# Patient Record
Sex: Male | Born: 1955 | Race: Black or African American | Hispanic: No | Smoking: Never smoker
Health system: Southern US, Community
[De-identification: ages and names within clinical notes are randomized; demographics above are authoritative.]

## PROBLEM LIST (undated history)

## (undated) DIAGNOSIS — J45909 Unspecified asthma, uncomplicated: Secondary | ICD-10-CM

## (undated) HISTORY — DX: Unspecified asthma, uncomplicated: J45.909

---

## 1977-06-29 HISTORY — PX: FRACTURE SURGERY: SHX138

## 2013-01-11 ENCOUNTER — Ambulatory Visit: Payer: Self-pay

## 2013-01-11 ENCOUNTER — Telehealth: Payer: Self-pay | Admitting: Radiology

## 2013-01-11 ENCOUNTER — Ambulatory Visit: Payer: Self-pay | Admitting: Family Medicine

## 2013-01-11 VITALS — BP 132/72 | HR 91 | Temp 98.2°F | Resp 20 | Ht 73.0 in | Wt 263.0 lb

## 2013-01-11 DIAGNOSIS — M25522 Pain in left elbow: Secondary | ICD-10-CM

## 2013-01-11 DIAGNOSIS — M25552 Pain in left hip: Secondary | ICD-10-CM

## 2013-01-11 DIAGNOSIS — M25529 Pain in unspecified elbow: Secondary | ICD-10-CM

## 2013-01-11 DIAGNOSIS — M25559 Pain in unspecified hip: Secondary | ICD-10-CM

## 2013-01-11 DIAGNOSIS — R0902 Hypoxemia: Secondary | ICD-10-CM

## 2013-01-11 DIAGNOSIS — J45909 Unspecified asthma, uncomplicated: Secondary | ICD-10-CM

## 2013-01-11 MED ORDER — DOXYCYCLINE HYCLATE 100 MG PO TABS: 100.0000 mg | ORAL_TABLET | Freq: Two times a day (BID) | ORAL | Status: DC

## 2013-01-11 MED ORDER — ALBUTEROL SULFATE HFA 108 (90 BASE) MCG/ACT IN AERS: 2.0000 | INHALATION_SPRAY | Freq: Four times a day (QID) | RESPIRATORY_TRACT | Status: AC | PRN

## 2013-01-11 NOTE — Patient Instructions (Addendum)
I will be in touch with you with your D dimer result later today. This will determine if we need to do a CT scan to rule- out a pulmonary embolus.    I am also going to put you on a course of doxycycline antibiotic because the radiologist feels you may have mild bronchitis.  I refilled your albuterol inhaler.  Also, there is a 3mm nodule in your right lung which is probably just a scar.  You might want to have a recheck x-ray in 3- 6 months.  We have given you a CD of your films, and a copy of your x-ray reports.    Let me know if your hip and/ or elbow are not feeling better.  Wear the splint on your left elbow for the next few days.  If your elbow feels much better in 5 day or so it is unlikely to be fractured.  However, if it is still tender you may have a fracture.  In this case please come in for a recheck

## 2013-01-11 NOTE — Progress Notes (Signed)
[facility] [address], [place] [address] [phone]  Date:  01/11/2013   Name:  Ronnie Phillips   DOB:  1955/11/11   MRN:  [id number]  PCP:  No primary provider on file.    Chief Complaint: Fall, Sciatica and Asthma   History of Present Illness:  Ronnie Phillips is a 57 y.o. very pleasant male patient who presents with the following:  He has a history of asthma. His sx have been worse for the last week due to weather changes.  He noted that his sx became worse when he arrived in [place] about 4 days ago He fell last night at his hotel; he caught his cane in the revolving door.  He fell onto his left side,  hurt his left elbow, and left hip. He applied ice, but is now worried about the elbow.    He uses singulair, and albuterol.  He uses symbicort.  He has sciatica in his left leg and uses a cane sometimes at baseline- he is uisiong this now.  He has a histoyr of pulmonary HTN and uses ?O2 at night at baseline- this may actually be a CPAP machine.    He is here visiting from out of town for a convention- he lives in [place] but travels quite a bit.    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   Non- smoker Never had a DVT/ PE but reports he has been tested for this in the past- he thinks twice- with CT angiogram.    There are no active problems to display for this patient.   Past Medical History  Diagnosis Date  . Asthma     Past Surgical History  Procedure Laterality Date  . Fracture surgery Left 1979    ELBOW    History  Substance Use Topics  . Smoking status: Never Smoker   . Smokeless tobacco: Not on file  . Alcohol Use: No    History reviewed. No pertinent family history.  Allergies no known allergies  Medication list has been reviewed and updated.  No current outpatient prescriptions on file prior to visit.   No current facility-administered medications  on file prior to visit.    Review of Systems:  As per HPI- otherwise negative. He has seen doctors around [place] in the past, mostly in the [place] area.  He has never been seen at [facility] in the past.    Physical Examination: Filed Vitals:   01/11/13 1152  BP: 132/72  Pulse: 91  Temp:   Resp:    Filed Vitals:   01/11/13 0945  Height: 6\' 1"  (1.854 m)  Weight: 263 lb (119.296 kg)   Body mass index is 34.71 kg/(m^2). Ideal Body Weight: Weight in (lb) to have BMI = 25: 189.1  GEN: WDWN, NAD, Non-toxic, A & O x 3, obese HEENT: Atraumatic, Normocephalic. Neck supple. No masses, No LAD.  Bilateral TM wnl, oropharynx normal.  PEERL,EOMI.   Ears and Nose: No external deformity. CV: RRR, No M/G/R. No JVD. No thrill. No extra heart sounds. PULM: CTA B, no wheezes, crackles, rhonchi. No retractions. No resp. distress. No accessory muscle use. ABD: S, NT, ND. No rebound. No HSM. No particular pain over the sternum.   EXTR: No c/c/e NEURO slow gait, cane right hand.  PSYCH: Normally interactive. Conversant. Not depressed or anxious appearing.  Calm demeanor.  Left elbow: surgical scars.  Not able to fully extend but this is baseline per pt.  He has normal flexion and supination/ pronation.  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   No saddle anesthesia.  However, there is no severe pain to indicate a fracture in the hip or femur.   [facility] reading (PRIMARY) by  Dr. Patsy Lager. CXR:  chonic appearing scarring, no infiltrate or effusion noted  Left Eelbow:  Past history of major elbow surgery.  No acute fracture noted Left hip: no definite fracture, severe degenerative change  Left femur: negative for fracture  LEFT ELBOW - COMPLETE 3+ VIEW  Comparison: None.  Findings: Prominent degenerative changes limit evaluation for detection of subtle  injury. There is minimal bony overgrowth of the radial head which appears remote without definitive fracture noted.  Mild soft tissue prominence olecranon region. Soft tissue injury not excluded.  IMPRESSION: Degenerative changes without acute fracture noted. Please see above.  Clinically significant discrepancy from primary report, if provided: None  *RADIOLOGY REPORT*  Clinical Data: History of asthma worse over past week. Fall.  CHEST - 2 VIEW  Comparison: None.  Findings: Curvature/slight step off at the sternomanubrial junction on lateral view. If the patient is tender here, acute injury may need to be considered.  3 mm nodule peripheral aspect right lung appears dense and may represent a granuloma. Stability can be confirmed over time with next followup examination in 3-6 months.  No infiltrate, congestive heart failure or pneumothorax.  Heart size within normal limits.  Central pulmonary vascular prominence.  Peribronchial thickening may reflect changes of asthma/mild bronchitis.  Partial fusion thoracic spine.  IMPRESSION: Curvature/slight step off at the sternomanubrial junction on lateral view. If the patient is tender here, acute injury may need to be considered.  3 mm nodule peripheral aspect right lung appears dense and may represent a granuloma. Stability can be confirmed over time with next followup examination in 3-6 months.  Peribronchial thickening may reflect changes of asthma/mild bronchitis.  LEFT HIP - COMPLETE 2+ VIEW  Comparison: None.  Findings: Severe left hip joint degenerative changes with collapse of the superior aspect of the left femoral head. No superimposed acute fracture detected although evaluation for subtle injury limited by plain film exam.  Moderate to marked right hip joint degenerative changes.  IMPRESSION: Severe left hip joint degenerative changes with collapse of the superior aspect of the left femoral head.  No superimposed acute fracture detected although evaluation for subtle injury limited by plain film exam.  Moderate to marked right hip joint degenerative changes.  Clinically significant discrepancy from primary report, if provided: None  LEFT FEMUR - 2 VIEW  Comparison: Left hip films same date.  Findings: Single view of the left femoral shaft which does not include the left knee without fracture noted.  IMPRESSION: Single view of the left femoral shaft which does not include the left knee without fracture noted.  Clinically significant discrepancy from primary report, if provided: None  Assessment and Plan: Asthma, chronic, unspecified asthma severity, uncomplicated - Plan: DG Chest 2 View, albuterol (PROVENTIL HFA;VENTOLIN HFA) 108 (90 BASE) MCG/ACT inhaler, doxycycline (VIBRA-TABS) 100 MG tablet  Pain, elbow, left - Plan: DG Elbow Complete Left  Pain, hip, left - Plan: DG Hip Complete Left, DG Femur Left  Hypoxemia - Plan: D-dimer, quantitative  Ronnie Phillips is here with a few concerns today.  1. Chronic asthma/ current exacerbation and possible bronchitis on CXR.  He does not have fever or any acute wheezing.  Called one of the offices he has seen in the past, and learned that his basdline O2 sat has been 92- 94^% in the past.  Continue albuterol and refilled this for him.  rx for doxycycline 2. Elbow pain: no apparent fracture, but post- surgical/ degenerative changes limit exam.  Placed in a  Sugar tong splint and sling for protection.  If still painful in a few days recheck 3. Hip pain: possible subtle fracture.  Severe degenerative changes complicate plain films.  Offered to perform a CT scan to rule- out a fracture.  At this time he declines.   4. Tachycardia and hypoxemia.  Both improved on recheck. Discussed possible PE, and the risks and benefits of performing a D dimer test.  He would like to do a D dimer today.  He went home to await D dimer results.   5. Chest x-ray  findings.  Discussed with pt in detail.  Advised him to have his PCP at home compare his CXR with past films.  Can have repeat if needed.  Possible sternal fracture.  He does not have acute tenderness over the sternum to suggest a sternal fracture.  However, a fracture is possible. Offered a CT to evaluate this further.  He decline at this time.   Signed Abbe Amsterdam, MD  Received D dimer results- positive at 1.95.  Called back to discuss with pt.  He is aware that a positive D dimer could indicate a PE, which can be fatal.  He asked for price quotes for CT angiogram.  Called to get these quotes- when I called back again there was no answer, and his VM is full, cannot LMOM.  Called several times between 5 and 9 pm- persistently no answer.  There are no other numbers and I have no other way to reach him

## 2013-01-11 NOTE — Telephone Encounter (Signed)
D dimer elevated 1.95

## 2013-01-12 ENCOUNTER — Telehealth: Payer: Self-pay | Admitting: Family Medicine

## 2013-01-12 NOTE — Telephone Encounter (Signed)
Called him and was able to [facility].  Please try to pick up when I call back so we can discuss,  Thanks!

## 2013-01-13 NOTE — Telephone Encounter (Signed)
LMOM that we are calling to check on him to see if he is still having any Sxs and to give him pricing for CT Angiogram if he is interested in having that done to rule out PE. Asked pt to CB if we can do anything else to help him or if he is still having any Sxs and/or wants [place] to schedule the CT angio.

## 2013-01-15 ENCOUNTER — Telehealth: Payer: Self-pay | Admitting: Family Medicine

## 2013-01-15 ENCOUNTER — Encounter: Payer: Self-pay | Admitting: Family Medicine

## 2013-01-15 NOTE — Telephone Encounter (Signed)
Tried one more time to reach him.  LMOM- if he wants any help please call. Otherwise I will just send a copy of his labs.

## 2015-01-23 ENCOUNTER — Inpatient Hospital Stay (HOSPITAL_COMMUNITY)
Admission: EM | Admit: 2015-01-23 | Discharge: 2015-01-24 | DRG: 202 | Disposition: A | Discharge status: Home / Self Care | Payer: Self-pay | Attending: Internal Medicine | Admitting: Internal Medicine

## 2015-01-23 ENCOUNTER — Emergency Department (HOSPITAL_COMMUNITY): Payer: Self-pay

## 2015-01-23 ENCOUNTER — Encounter (HOSPITAL_COMMUNITY): Payer: Self-pay | Admitting: Emergency Medicine

## 2015-01-23 DIAGNOSIS — E8729 Other acidosis: Secondary | ICD-10-CM

## 2015-01-23 DIAGNOSIS — J45901 Unspecified asthma with (acute) exacerbation: Principal | ICD-10-CM

## 2015-01-23 DIAGNOSIS — I878 Other specified disorders of veins: Secondary | ICD-10-CM | POA: Diagnosis present

## 2015-01-23 DIAGNOSIS — E872 Acidosis: Secondary | ICD-10-CM | POA: Diagnosis present

## 2015-01-23 DIAGNOSIS — Z79899 Other long term (current) drug therapy: Secondary | ICD-10-CM

## 2015-01-23 DIAGNOSIS — Z7951 Long term (current) use of inhaled steroids: Secondary | ICD-10-CM

## 2015-01-23 DIAGNOSIS — R600 Localized edema: Secondary | ICD-10-CM

## 2015-01-23 DIAGNOSIS — R0902 Hypoxemia: Secondary | ICD-10-CM | POA: Diagnosis present

## 2015-01-23 DIAGNOSIS — Z91010 Allergy to peanuts: Secondary | ICD-10-CM

## 2015-01-23 LAB — BASIC METABOLIC PANEL
Anion gap: 10 (ref 5–15)
BUN: 11 mg/dL (ref 6–20)
CHLORIDE: 95 mmol/L — AB (ref 101–111)
CO2: 33 mmol/L — ABNORMAL HIGH (ref 22–32)
Calcium: 9 mg/dL (ref 8.9–10.3)
Creatinine, Ser: 0.71 mg/dL (ref 0.61–1.24)
GFR calc non Af Amer: >60 mL/min (ref >60)
Glucose, Bld: 120 mg/dL — ABNORMAL HIGH (ref 65–99)
Potassium: 3.8 mmol/L (ref 3.5–5.1)
Sodium: 138 mmol/L (ref 135–145)

## 2015-01-23 LAB — CBC WITH DIFFERENTIAL/PLATELET
BASOS PCT: 0 % (ref 0–1)
Basophils Absolute: 0 10*3/uL (ref 0.0–0.1)
EOS ABS: 0.7 10*3/uL (ref 0.0–0.7)
Eosinophils Relative: 7 % — ABNORMAL HIGH (ref 0–5)
HCT: 46.3 % (ref 39.0–52.0)
HEMOGLOBIN: 14.2 g/dL (ref 13.0–17.0)
LYMPHS ABS: 2.2 10*3/uL (ref 0.7–4.0)
Lymphocytes Relative: 21 % (ref 12–46)
MCH: 27.9 pg (ref 26.0–34.0)
MCHC: 30.7 g/dL (ref 30.0–36.0)
MCV: 91 fL (ref 78.0–100.0)
Monocytes Absolute: 0.8 10*3/uL (ref 0.1–1.0)
Monocytes Relative: 7 % (ref 3–12)
NEUTROS PCT: 65 % (ref 43–77)
Neutro Abs: 7.2 10*3/uL (ref 1.7–7.7)
Platelets: 225 10*3/uL (ref 150–400)
RBC: 5.09 MIL/uL (ref 4.22–5.81)
RDW: 14.5 % (ref 11.5–15.5)
WBC: 10.9 10*3/uL — ABNORMAL HIGH (ref 4.0–10.5)

## 2015-01-23 LAB — I-STAT ARTERIAL BLOOD GAS, ED
ACID-BASE EXCESS: 8 mmol/L — AB (ref 0.0–2.0)
BICARBONATE: 36.4 meq/L — AB (ref 20.0–24.0)
O2 SAT: 100 %
TCO2: 38 mmol/L (ref 0–100)
pCO2 arterial: 66.9 mmHg (ref 35.0–45.0)
pH, Arterial: 7.342 — ABNORMAL LOW (ref 7.350–7.450)
pO2, Arterial: 194 mmHg — ABNORMAL HIGH (ref 80.0–100.0)

## 2015-01-23 LAB — I-STAT TROPONIN, ED: TROPONIN I, POC: 0.01 ng/mL (ref 0.00–0.08)

## 2015-01-23 LAB — BRAIN NATRIURETIC PEPTIDE: B Natriuretic Peptide: 15.3 pg/mL (ref 0.0–100.0)

## 2015-01-23 MED ORDER — ALBUTEROL (5 MG/ML) CONTINUOUS INHALATION SOLN
15.0000 mg/h | INHALATION_SOLUTION | RESPIRATORY_TRACT | Status: DC
  Administered 2015-01-23: 15 mg/h via RESPIRATORY_TRACT
  Filled 2015-01-23: qty 20

## 2015-01-23 MED ORDER — ENOXAPARIN SODIUM 40 MG/0.4ML ~~LOC~~ SOLN
40.0000 mg | SUBCUTANEOUS | Status: DC
  Administered 2015-01-23: 40 mg via SUBCUTANEOUS
  Filled 2015-01-23: qty 0.4

## 2015-01-23 MED ORDER — METHYLPREDNISOLONE SODIUM SUCC 125 MG IJ SOLR
80.0000 mg | Freq: Two times a day (BID) | INTRAMUSCULAR | Status: DC
Start: 2015-01-23 — End: 2015-01-24
  Administered 2015-01-23: 80 mg via INTRAVENOUS
  Filled 2015-01-23: qty 2

## 2015-01-23 MED ORDER — SODIUM CHLORIDE 0.9 % IJ SOLN: 3.0000 mL | INTRAMUSCULAR | Status: DC | PRN

## 2015-01-23 MED ORDER — IPRATROPIUM-ALBUTEROL 0.5-2.5 (3) MG/3ML IN SOLN
3.0000 mL | RESPIRATORY_TRACT | Status: DC
  Administered 2015-01-23 – 2015-01-24 (×7): 3 mL via RESPIRATORY_TRACT
  Filled 2015-01-23 (×7): qty 3

## 2015-01-23 MED ORDER — SODIUM CHLORIDE 0.9 % IV SOLN: 250.0000 mL | INTRAVENOUS | Status: DC | PRN

## 2015-01-23 MED ORDER — ALBUTEROL SULFATE (2.5 MG/3ML) 0.083% IN NEBU: 2.5000 mg | INHALATION_SOLUTION | RESPIRATORY_TRACT | Status: DC | PRN

## 2015-01-23 MED ORDER — TRAMADOL HCL 50 MG PO TABS
50.0000 mg | ORAL_TABLET | Freq: Four times a day (QID) | ORAL | Status: DC | PRN
  Administered 2015-01-23: 50 mg via ORAL
  Filled 2015-01-23: qty 1

## 2015-01-23 MED ORDER — SODIUM CHLORIDE 0.9 % IJ SOLN
3.0000 mL | Freq: Two times a day (BID) | INTRAMUSCULAR | Status: DC
  Administered 2015-01-23: 3 mL via INTRAVENOUS

## 2015-01-23 MED ORDER — MAGNESIUM SULFATE 2 GM/50ML IV SOLN
2.0000 g | Freq: Once | INTRAVENOUS | Status: AC
  Administered 2015-01-23: 2 g via INTRAVENOUS
  Filled 2015-01-23: qty 50

## 2015-01-23 MED ORDER — ALBUTEROL SULFATE (2.5 MG/3ML) 0.083% IN NEBU
5.0000 mg | INHALATION_SOLUTION | Freq: Once | RESPIRATORY_TRACT | Status: AC
Start: 2015-01-23 — End: 2015-01-23
  Administered 2015-01-23: 5 mg via RESPIRATORY_TRACT
  Filled 2015-01-23: qty 6

## 2015-01-23 MED ORDER — SODIUM CHLORIDE 0.9 % IJ SOLN
3.0000 mL | Freq: Two times a day (BID) | INTRAMUSCULAR | Status: DC
  Administered 2015-01-23 – 2015-01-24 (×2): 3 mL via INTRAVENOUS

## 2015-01-23 NOTE — ED Provider Notes (Signed)
9:10 AM patient expresses that he does not wish to stay in the hospital. His breathing is much improved over initial presentation. On exam he speaks in paragraphs. Lungs with end expiratory wheezes. No respiratory distress.  1:30 PM after treatment with 2 additional nebulized treatments and with treatment with intravenous magnesium patient's breathing is still not at baseline. Pulse oximetry on room air 90%. He becomes mildly dyspneic with ambulation. I recontacted Dr.Hoffman who made arrangements for inpatient stay  Doug Sou, MD 01/23/15 1354

## 2015-01-23 NOTE — Progress Notes (Signed)
Rec'd pt on bipap/ps via Servo, on ps 10, 5 peep, 35%.  Pt transported to ED rm 15 via vent/bipap w/ no apparent complications.

## 2015-01-23 NOTE — ED Notes (Signed)
Pt is here on business, from [place], picked up from the [place] hotel.

## 2015-01-23 NOTE — ED Notes (Signed)
Ambulated patient with pulse oximetry approximately 40 feet or so utilizing a walker.  Lowest recorded SpO2 was 90% on room air.  Highest recorded HR was 121.  Patient states that he is tired and appeared short of breath near the end of the exercise.

## 2015-01-23 NOTE — Progress Notes (Signed)
On arrival patient placed on our Bipap setting 10/5 50%. ABG obtained stat.. shows C02 value is in the 60's out  Of range.  ABG results shown to MD. Rt increased settings on NIV Bipap to 15/5 and decreased fi02 to 35% due to P02 being high.  Hour long CAT Albuterol given inline through Bipap.  Will obtain another ABG to verify setting are agreeing with patient in a hour.

## 2015-01-23 NOTE — H&P (Signed)
Date: 01/23/2015               Patient Name:  Ronnie Phillips MRN: [id number]  DOB: 1956/05/29 Age / Sex: 59 y.o., male   PCP: No primary care provider on file.         Medical Service: Internal Medicine Teaching Service         Attending Physician: Dr. Earl Lagos, MD    First Contact: Dr. Reggie Pile Pager: [phone]  Second Contact: Dr. Lauris Chroman Pager: [phone]       After Hours (After 5p/  First Contact Pager: [phone]  weekends / holidays): Second Contact Pager: [phone]   Chief Complaint: dyspnea  History of Present Illness: Ronnie Phillips is a 59 yo male with PMH of Asthma who is visiting [place] for a church convention.  He reports that he has been compliant with his home controller medications including breo and PRN albuterol but that his hotel at first did not honor his request for no feather pillows and his first night he was exposed to feather pillows which worsened his asthma (he has since had the hotel remove the pillows) as this is a known trigger for him.  H e also feels that there is something in the "air" of the convention center that is triggering his asthma.  He reports progressive SOB and wheezing over the last day that culminated this morning to the point he felt he was unable to breath.  He called EMS who reported minimal breath sounds, he was given duonebs and  of IV solumedrol en route to [facility].  In the ED he was noted to be hypoxic, ABG revealed a partially compensated respiratory acidosis.  He was given further nebulizer treatments in the ED and placed on Bipap, IMTS was called by Dr Elesa Massed for admission.  He reports he has had multiple admissions for asthma exacerbations.  He has never been intubated.    Meds: Current Facility-Administered Medications  Medication Dose Route Frequency Provider Last Rate Last Dose  . albuterol (PROVENTIL,VENTOLIN) solution continuous neb  15 mg/hr Nebulization Continuous Kristen N Ward, DO 3 mL/hr at 01/23/15 0504 15  mg/hr at 01/23/15 0504  . ipratropium-albuterol (DUONEB) 0.5-2.5 (3) MG/3ML nebulizer solution 3 mL  3 mL Nebulization Q4H Kristen N Ward, DO       Current Outpatient Prescriptions  Medication Sig Dispense Refill  . albuterol (PROVENTIL HFA;VENTOLIN HFA) 108 (90 BASE) MCG/ACT inhaler Inhale 2 puffs into the lungs every 6 (six) hours as needed for wheezing. 1 each 3  . albuterol (PROVENTIL) (2.5 MG/3ML) 0.083% nebulizer solution Take 2.5 mg by nebulization every 6 (six) hours as needed for wheezing.    Marland Kitchen doxycycline (VIBRA-TABS) 100 MG tablet Take 1 tablet (100 mg total) by mouth 2 (two) times daily. 20 tablet 0  . traMADol (ULTRAM) 50 MG tablet Take 50 mg by mouth every 6 (six) hours as needed.      Allergies: Allergies as of 01/23/2015 - Review Complete 01/23/2015  Allergen Reaction Noted  . Peanut-containing drug products Hives and Shortness Of Breath 01/23/2015   Past Medical History  Diagnosis Date  . Asthma    Past Surgical History  Procedure Laterality Date  . Fracture surgery Left 1979    ELBOW   History reviewed. No pertinent family history. History   Social History  . Marital Status: Divorced    Spouse Name: N/A  . Number of Children: N/A  . Years of Education: N/A   Occupational History  .  Not on file.   Social History Main Topics  . Smoking status: Never Smoker   . Smokeless tobacco: Not on file  . Alcohol Use: No  . Drug Use: No  . Sexual Activity: Not on file   Other Topics Concern  . Not on file   Social History Narrative    Review of Systems: Review of Systems  Constitutional: Negative for fever, chills and malaise/fatigue.  Eyes: Negative for blurred vision.  Respiratory: Positive for shortness of breath and wheezing. Negative for cough and sputum production.   Cardiovascular: Positive for leg swelling (chronic). Negative for chest pain and claudication.  Gastrointestinal: Negative for abdominal pain.  Genitourinary: Negative for dysuria.    Skin: Negative for rash.  Psychiatric/Behavioral: Negative for substance abuse.     Physical Exam: Blood pressure 123/58, pulse 108, temperature 98.3 F (36.8 C), resp. rate 13, SpO2 99 %. Physical Exam  Constitutional: He is well-developed, well-nourished, and in no distress.  Initally on bipap, this was removed and he was placed on Maish Vaya.  HENT:  Head: Normocephalic and atraumatic.  Cardiovascular: Normal rate, regular rhythm and normal heart sounds.   Pulmonary/Chest: Effort normal. He has wheezes (minimal end expiratory wheezing).  Abdominal: Soft. Bowel sounds are normal. There is no tenderness.  Musculoskeletal: He exhibits edema (1-2+ with chronic venous stasis changes to skin).  Skin: Skin is warm and dry.  Nursing note and vitals reviewed.    Lab results: Basic Metabolic Panel:  Recent Labs  82/95/62 0453  NA 138  K 3.8  CL 95*  CO2 33*  GLUCOSE 120*  BUN 11  CREATININE 0.71  CALCIUM 9.0   Liver Function Tests: No results for input(s): AST, ALT, ALKPHOS, BILITOT, PROT, ALBUMIN in the last 72 hours. No results for input(s): LIPASE, AMYLASE in the last 72 hours. No results for input(s): AMMONIA in the last 72 hours. CBC:  Recent Labs  01/23/15 0453  WBC 10.9*  NEUTROABS 7.2  HGB 14.2  HCT 46.3  MCV 91.0  PLT 225    Imaging results:  Dg Chest Portable 1 View  01/23/2015   CLINICAL DATA:  Shortness of breath tonight.  EXAM: PORTABLE CHEST - 1 VIEW  COMPARISON:  01/11/2013  FINDINGS: The heart is at the upper limits of normal in size. Pulmonary vasculature is normal. Calcified granuloma in the right upper lung zone, unchanged. Bronchial thickening appears chronic and unchanged. No confluent airspace disease. No large pleural effusion or pneumothorax.  IMPRESSION: Chronic bronchial thickening, similar to prior exam. No superimposed acute process.   Electronically Signed   By: Rubye Oaks M.D.   On: 01/23/2015 05:17    Other results: [id number]:QMVHQ  tachycardia, no ST or T wave changes, no previous available for comparison  Assessment & Plan by Problem:   Asthma exacerbation - We were asked to admit for asthma exacerbation.  Patient is currently feeling a little better, I deescalated him to [place].   He wishes to avoid admission if possible. - Ordered IV Mag 2 g - Continue duoneb treatment - Discussed with attending ED provider Dr Ethelda Chick - Will plan to titrate down to room air and ambulate, if maintains his sat he will discharge from ED. If not we will admit and continue steroids, duoneb, and supplemental O2. ADDENDUM: Patient still dyspneic with ambulation with O2 sat dropping to 90s per dr Ethelda Chick, agreeable for admission.  At this point he does not need BiPAP and we can admit to a telemetry bed.   -  Will continue duonebs Q4 scheduled - Solumedrol 80 IV BID - Monitor peak flows  Dispo: Disposition is deferred at this time, awaiting improvement of current medical problems.   The patient does have a current PCP (No primary care provider on file.) and does not need an [facility] hospital follow-up appointment after discharge.  The patient does not have transportation limitations that hinder transportation to clinic appointments.  Signed: Gust Rung, DO 01/23/2015, 7:43 AM

## 2015-01-23 NOTE — Progress Notes (Signed)
Called ED to get report.  ED RN stated that he would call me back to give report.  Peri Maris, MBA, BS, RN

## 2015-01-23 NOTE — ED Notes (Signed)
Pt presents with [facility] for SOB x 1.5 days; Fire dept on scene reported 76% on room air initially; pt arrived to ER on CPAP with 99% sats; pt CAOx4; EMS gave  Albuterol,  Atrovent,  Solumedrol; pt reports hx of asthma

## 2015-01-23 NOTE — ED Provider Notes (Signed)
TIME SEEN: 4:40 AM  CHIEF COMPLAINT: Respiratory distress  HPI: Pt is a 59 y.o. male with history of asthma who does not wear oxygen who presents to the emergency department with respiratory distress. Reports he is here from [place] and a conference. States for the past day and a half he has been short of breath, wheezing. Was found to be hypoxic with oxygen saturation of 76% on room air with fire department. Was placed on C Pap with EMS and given 10 mg of albuterol, 1 mg of Atrovent, 125 mg of IV Solu-Medrol. Patient reports feeling better. No chest pain. No fever. No cough. No lower extremity swelling or pain.  ROS: See HPI Constitutional: no fever  Eyes: no drainage  ENT: no runny nose   Cardiovascular:  no chest pain  Resp:  SOB  GI: no vomiting GU: no dysuria Integumentary: no rash  Allergy: no hives  Musculoskeletal: no leg swelling  Neurological: no slurred speech ROS otherwise negative  PAST MEDICAL HISTORY/PAST SURGICAL HISTORY:  Past Medical History  Diagnosis Date  . Asthma     MEDICATIONS:  Prior to Admission medications   Medication Sig Start Date End Date Taking? Authorizing Provider  albuterol (PROVENTIL HFA;VENTOLIN HFA) 108 (90 BASE) MCG/ACT inhaler Inhale 2 puffs into the lungs every 6 (six) hours as needed for wheezing. 01/11/13   Gwenlyn Found Copland, MD  albuterol (PROVENTIL) (2.5 MG/3ML) 0.083% nebulizer solution Take 2.5 mg by nebulization every 6 (six) hours as needed for wheezing.    Historical Provider, MD  doxycycline (VIBRA-TABS) 100 MG tablet Take 1 tablet (100 mg total) by mouth 2 (two) times daily. 01/11/13   Gwenlyn Found Copland, MD  montelukast (SINGULAIR) 10 MG tablet Take 10 mg by mouth at bedtime.    Historical Provider, MD    ALLERGIES:  Allergies  Allergen Reactions  . Peanut-Containing Drug Products     SOCIAL HISTORY:  History  Substance Use Topics  . Smoking status: Never Smoker   . Smokeless tobacco: Not on file  . Alcohol Use: No     FAMILY HISTORY: History reviewed. No pertinent family history.  EXAM: SpO2 99% CONSTITUTIONAL: Alert and oriented and responds appropriately to questions. Well-appearing; well-nourished HEAD: Normocephalic EYES: Conjunctivae clear, PERRL ENT: normal nose; no rhinorrhea; moist mucous membranes; pharynx without lesions noted NECK: Supple, no meningismus, no LAD  CARD: RRR; S1 and S2 appreciated; no murmurs, no clicks, no rubs, no gallops RESP: Patient is tachypneic, speaking short sentences, completely diminished breath sounds bilaterally without appreciable wheezing, rhonchi or rales ABD/GI: Normal bowel sounds; non-distended; soft, non-tender, no rebound, no guarding, no peritoneal signs BACK:  The back appears normal and is non-tender to palpation, there is no CVA tenderness EXT: Normal ROM in all joints; non-tender to palpation; no edema; normal capillary refill; no cyanosis, no calf tenderness or swelling    SKIN: Normal color for age and race; warm NEURO: Moves all extremities equally, sensation to light touch intact diffusely, cranial nerves II through XII intact PSYCH: The patient's mood and manner are appropriate. Grooming and personal hygiene are appropriate.  MEDICAL DECISION MAKING: Patient here with asthma exacerbation. He has been placed on BiPAP. ABG shows respiratory acidosis. We'll give continuous albuterol, obtain labs and chest x-ray. Anticipate admission.  ED PROGRESS: Patient's labs otherwise unremarkable including normal troponin and BNP. Chest x-ray shows no infiltrate or edema. Patient now has better aeration and is now wheezing. We'll give duo nebs. Discussed with internal medicine resident service for admission  to a stepdown bed. Attending is Dr. Heide Spark.    EKG Interpretation  Date/Time:  Wednesday January 23 2015 04:47:22 EDT Ventricular Rate:  110 PR Interval:  154 QRS Duration: 115 QT Interval:  365 QTC Calculation: 494 R Axis:   121 Text  Interpretation:  Sinus tachycardia Atrial premature complex RBBB and LPFB No old tracing to compare Confirmed by WARD,  DO, KRISTEN ([id number]) on 01/23/2015 5:28:59 AM        CRITICAL CARE Performed by: Raelyn Number   Total critical care time: 40 minutes  Critical care time was exclusive of separately billable procedures and treating other patients.  Critical care was necessary to treat or prevent imminent or life-threatening deterioration.  Critical care was time spent personally by me on the following activities: development of treatment plan with patient and/or surrogate as well as nursing, discussions with consultants, evaluation of patient's response to treatment, examination of patient, obtaining history from patient or surrogate, ordering and performing treatments and interventions, ordering and review of laboratory studies, ordering and review of radiographic studies, pulse oximetry and re-evaluation of patient's condition.   Layla Maw Ward, DO 01/23/15 [phone]

## 2015-01-23 NOTE — Progress Notes (Signed)
MD at bedside and wanted to wean pt off bipap if pt tol.  Pt appears to tol being on [place] presently.  HR 98, sat 98%, no distress noted.

## 2015-01-24 DIAGNOSIS — E872 Acidosis: Secondary | ICD-10-CM

## 2015-01-24 LAB — BASIC METABOLIC PANEL
Anion gap: 6 (ref 5–15)
BUN: 9 mg/dL (ref 6–20)
CHLORIDE: 101 mmol/L (ref 101–111)
CO2: 33 mmol/L — ABNORMAL HIGH (ref 22–32)
Calcium: 9.1 mg/dL (ref 8.9–10.3)
Creatinine, Ser: 0.64 mg/dL (ref 0.61–1.24)
GFR calc Af Amer: >60 mL/min (ref >60)
GFR calc non Af Amer: >60 mL/min (ref >60)
Glucose, Bld: 125 mg/dL — ABNORMAL HIGH (ref 65–99)
Potassium: 4.7 mmol/L (ref 3.5–5.1)
Sodium: 140 mmol/L (ref 135–145)

## 2015-01-24 MED ORDER — GUAIFENESIN ER 600 MG PO TB12: 600.0000 mg | ORAL_TABLET | Freq: Two times a day (BID) | ORAL | Status: AC

## 2015-01-24 MED ORDER — PREDNISONE 20 MG PO TABS
40.0000 mg | ORAL_TABLET | Freq: Every day | ORAL | Status: DC
  Administered 2015-01-24: 40 mg via ORAL
  Filled 2015-01-24: qty 2

## 2015-01-24 MED ORDER — POLYETHYLENE GLYCOL 3350 17 G PO PACK
17.0000 g | PACK | Freq: Every day | ORAL | Status: DC
  Administered 2015-01-24: 17 g via ORAL
  Filled 2015-01-24: qty 1

## 2015-01-24 MED ORDER — PREDNISONE 20 MG PO TABS: 20.0000 mg | ORAL_TABLET | Freq: Every day | ORAL | Status: AC

## 2015-01-24 MED ORDER — IBUPROFEN 400 MG PO TABS
400.0000 mg | ORAL_TABLET | Freq: Once | ORAL | Status: AC
  Administered 2015-01-24: 400 mg via ORAL
  Filled 2015-01-24: qty 1

## 2015-01-24 NOTE — Progress Notes (Signed)
Subjective: Pt seen and examined at bedside. States he is doing well and is not SOB anymore. O2 saturation 93-97% on 2L via [place]. Pt complaining of bilateral lower extremity swelling.    Objective: Vital signs in last 24 hours: Filed Vitals:   01/24/15 0518 01/24/15 0731 01/24/15 0851 01/24/15 1144  BP: 132/75  145/84   Pulse: 90  92   Temp: 97.9 F (36.6 C)  97.8 F (36.6 C)   TempSrc: Oral  Oral   Resp: 17  18   Height:      Weight:      SpO2: 95% 97% 93% 96%   Weight change:   Intake/Output Summary (Last 24 hours) at 01/24/15 1633 Last data filed at 01/24/15 1314  Gross per 24 hour  Intake   1563 ml  Output   1825 ml  Net   -262 ml   Physical Exam  Constitutional: He is well-developed, well-nourished, and in no distress. On 2L O2 via [place].  HENT:  Head: Normocephalic and atraumatic.  Cardiovascular: Normal rate, regular rhythm and normal heart sounds.  Pulmonary/Chest: Effort normal. Lungs CTAB. No wheezes, rales, or rhonchi. Abdominal: Soft. Bowel sounds are normal. There is no tenderness.  Musculoskeletal: He exhibits edema (1-2+ with chronic venous stasis changes to skin).  Skin: Skin is warm and dry.  Nursing note and vitals reviewed.  Lab Results: Basic Metabolic Panel:  Recent Labs Lab 01/23/15 0453 01/24/15 0513  NA 138 140  K 3.8 4.7  CL 95* 101  CO2 33* 33*  GLUCOSE 120* 125*  BUN 11 9  CREATININE 0.71 0.64  CALCIUM 9.0 9.1   CBC:  Recent Labs Lab 01/23/15 0453  WBC 10.9*  NEUTROABS 7.2  HGB 14.2  HCT 46.3  MCV 91.0  PLT 225     Micro Results: No results found for this or any previous visit (from the past 240 hour(s)). Studies/Results: Dg Chest Portable 1 View  01/23/2015   CLINICAL DATA:  Shortness of breath tonight.  EXAM: PORTABLE CHEST - 1 VIEW  COMPARISON:  01/11/2013  FINDINGS: The heart is at the upper limits of normal in size. Pulmonary vasculature is normal. Calcified granuloma in the right upper lung zone, unchanged.  Bronchial thickening appears chronic and unchanged. No confluent airspace disease. No large pleural effusion or pneumothorax.  IMPRESSION: Chronic bronchial thickening, similar to prior exam. No superimposed acute process.   Electronically Signed   By: Rubye Oaks M.D.   On: 01/23/2015 05:17   Medications: I have reviewed the patient's current medications. Scheduled Meds: . enoxaparin (LOVENOX) injection  40 mg Subcutaneous Q24H  . ipratropium-albuterol  3 mL Nebulization Q4H  . polyethylene glycol  17 g Oral Daily  . predniSONE  40 mg Oral Q breakfast  . sodium chloride  3 mL Intravenous Q12H  . sodium chloride  3 mL Intravenous Q12H   Continuous Infusions: . albuterol Stopped (01/23/15 1202)   PRN Meds:.sodium chloride, albuterol, sodium chloride, traMADol Assessment/Plan: Active Problems:   Asthma exacerbation   Respiratory acidosis  59 yo M with PMHx of asthma admitted for an asthma exacrbation after exposure to a known trigger (feather from pillows).   Asthma exacerbation Pt currently doing well and not complaining of any SOB or cough. O2 saturation 93-97% on 2L via [place]. Lungs are clear to auscultation, no wheezes.  - Continue duoneb treatment -discontinued Solu-medrol and started him on oral Prednisone 40 mg QD today  -supplemental O2 -Titrate down to room air and ambulate,  if maintains his sat he will likely be discharged today. -He will be discharged home with oral Prednisone taper x 12 days. Pt to f/u with PCP as outpatient.  -he will resume steroid inhaler at home after finishing course of prednisone taper.    Chronic venous stasis Patient complaned of some b/l leg pain last night.  -cont Ibuprofen prn    Dispo: Disposition is deferred at this time, awaiting improvement of current medical problems.  Anticipated discharge in approximately 0 day(s).   The patient does have a current PCP (No primary care provider on file.) and does need an [facility] hospital follow-up  appointment after discharge.  The patient does not have transportation limitations that hinder transportation to clinic appointments.  .Services Needed at time of discharge: Y = Yes, Blank = No PT:   OT:   RN:   Equipment:   Other:     LOS: 1 day   Ronnie Giovanni, MD 01/24/2015, 4:33 PM

## 2015-01-24 NOTE — Discharge Instructions (Signed)
Follow up appointment on 02/13/15 at 1:30 pm Dr. Maisie Fus P. Venetia Maxon  [address] [place], [place] [address] [phone]  Please take Prednisone 20 mg by mouth as directed: Take 40 mg (2 tablets) for 4 days. Then,  Take 20 mg (1 tablet) for 4 days. Then, Take 10 mg (1/2 tablet) for 4 days.   Resume Breo after finishing full course of Prednisone.       Asthma Asthma is a recurring condition in which the airways tighten and narrow. Asthma can make it difficult to breathe. It can cause coughing, wheezing, and shortness of breath. Asthma episodes, also called asthma attacks, range from minor to life-threatening. Asthma cannot be cured, but medicines and lifestyle changes can help control it. CAUSES Asthma is believed to be caused by inherited (genetic) and environmental factors, but its exact cause is unknown. Asthma may be triggered by allergens, lung infections, or irritants in the air. Asthma triggers are different for each person. Common triggers include:   Animal dander.  Dust mites.  Cockroaches.  Pollen from trees or grass.  Mold.  Smoke.  Air pollutants such as dust, household cleaners, hair sprays, aerosol sprays, paint fumes, strong chemicals, or strong odors.  Cold air, weather changes, and winds (which increase molds and pollens in the air).  Strong emotional expressions such as crying or laughing hard.  Stress.  Certain medicines (such as aspirin) or types of drugs (such as beta-blockers).  Sulfites in foods and drinks. Foods and drinks that may contain sulfites include dried fruit, potato chips, and sparkling grape juice.  Infections or inflammatory conditions such as the flu, a cold, or an inflammation of the nasal membranes (rhinitis).  Gastroesophageal reflux disease (GERD).  Exercise or strenuous activity. SYMPTOMS Symptoms may occur immediately after asthma is triggered or many hours later. Symptoms include:  Wheezing.  Excessive nighttime  or early morning coughing.  Frequent or severe coughing with a common cold.  Chest tightness.  Shortness of breath. DIAGNOSIS  The diagnosis of asthma is made by a review of your medical history and a physical exam. Tests may also be performed. These may include:  Lung function studies. These tests show how much air you breathe in and out.  Allergy tests.  Imaging tests such as X-rays. TREATMENT  Asthma cannot be cured, but it can usually be controlled. Treatment involves identifying and avoiding your asthma triggers. It also involves medicines. There are 2 classes of medicine used for asthma treatment:   Controller medicines. These prevent asthma symptoms from occurring. They are usually taken every day.  Reliever or rescue medicines. These quickly relieve asthma symptoms. They are used as needed and provide short-term relief. Your health care provider will help you create an asthma action plan. An asthma action plan is a written plan for managing and treating your asthma attacks. It includes a list of your asthma triggers and how they may be avoided. It also includes information on when medicines should be taken and when their dosage should be changed. An action plan may also involve the use of a device called a peak flow meter. A peak flow meter measures how well the lungs are working. It helps you monitor your condition. HOME CARE INSTRUCTIONS   Take medicines only as directed by your health care provider. Speak with your health care provider if you have questions about how or when to take the medicines.  Use a peak flow meter as directed by your health care provider. Record and keep  track of readings.  Understand and use the action plan to help minimize or stop an asthma attack without needing to seek medical care.  Control your home environment in the following ways to help prevent asthma attacks:  Do not smoke. Avoid being exposed to secondhand smoke.  Change your heating and  air conditioning filter regularly.  Limit your use of fireplaces and wood stoves.  Get rid of pests (such as roaches and mice) and their droppings.  Throw away plants if you see mold on them.  Clean your floors and dust regularly. Use unscented cleaning products.  Try to have someone else vacuum for you regularly. Stay out of rooms while they are being vacuumed and for a short while afterward. If you vacuum, use a dust mask from a hardware store, a double-layered or microfilter vacuum cleaner bag, or a vacuum cleaner with a HEPA filter.  Replace carpet with wood, tile, or vinyl flooring. Carpet can trap dander and dust.  Use allergy-proof pillows, mattress covers, and box spring covers.  Wash bed sheets and blankets every week in hot water and dry them in a dryer.  Use blankets that are made of polyester or cotton.  Clean bathrooms and kitchens with bleach. If possible, have someone repaint the walls in these rooms with mold-resistant paint. Keep out of the rooms that are being cleaned and painted.  Wash hands frequently. SEEK MEDICAL CARE IF:   You have wheezing, shortness of breath, or a cough even if taking medicine to prevent attacks.  The colored mucus you cough up (sputum) is thicker than usual.  Your sputum changes from clear or white to yellow, green, gray, or bloody.  You have any problems that may be related to the medicines you are taking (such as a rash, itching, swelling, or trouble breathing).  You are using a reliever medicine more than 2-3 times per week.  Your peak flow is still at 50-79% of your personal best after following your action plan for 1 hour.  You have a fever. SEEK IMMEDIATE MEDICAL CARE IF:   You seem to be getting worse and are unresponsive to treatment during an asthma attack.  You are short of breath even at rest.  You get short of breath when doing very little physical activity.  You have difficulty eating, drinking, or talking due to  asthma symptoms.  You develop chest pain.  You develop a fast heartbeat.  You have a bluish color to your lips or fingernails.  You are light-headed, dizzy, or faint.  Your peak flow is less than 50% of your personal best. MAKE SURE YOU:   Understand these instructions.  Will watch your condition.  Will get help right away if you are not doing well or get worse. Document Released: 06/15/2005 Document Revised: 10/30/2013 Document Reviewed: 01/12/2013 [facility] Patient Information 2015 [place], [place]. This information is not intended to replace advice given to you by your health care provider. Make sure you discuss any questions you have with your health care provider.

## 2015-01-24 NOTE — Progress Notes (Signed)
Utilization review completed. Cleta Heatley, RN, BSN. 

## 2015-01-24 NOTE — Progress Notes (Signed)
Patient to be discharged to home. Discharge instructions and medications reviewed with patient. PIV removed. Telemetry box #17 removed and returned to nurse's station.  Leanna Battles, RN.

## 2015-01-24 NOTE — Discharge Summary (Signed)
Name: Ronnie Phillips MRN: [id number] DOB: December 12, 1955 59 y.o. PCP: No primary care provider on file.  Date of Admission: 01/23/2015  4:39 AM Date of Discharge: 01/24/2015 Attending Physician: Ronnie Lagos, MD  Discharge Diagnosis:  Active Problems:   Asthma exacerbation   Respiratory acidosis  Discharge Medications:   Medication List    TAKE these medications        albuterol 108 (90 BASE) MCG/ACT inhaler  Commonly known as:  PROVENTIL HFA;VENTOLIN HFA  Inhale 2 puffs into the lungs every 6 (six) hours as needed for wheezing.     albuterol (2.5 MG/3ML) 0.083% nebulizer solution  Commonly known as:  PROVENTIL  Take 2.5 mg by nebulization every 6 (six) hours as needed for wheezing.     guaiFENesin 600 MG 12 hr tablet  Commonly known as:  MUCINEX  Take 1 tablet (600 mg total) by mouth 2 (two) times daily.     predniSONE 20 MG tablet  Commonly known as:  DELTASONE  Take 1 tablet (20 mg total) by mouth daily with breakfast. Take 40 mg (2 tablets) for 4 days. Then, take 20 mg (1 tablet) for 4 days. Then, take 1/2 tablet (10 mg) daily for 4 days.     traMADol 50 MG tablet  Commonly known as:  ULTRAM  Take 50 mg by mouth every 6 (six) hours as needed.        Disposition and follow-up:   Mr.Ronnie Phillips was discharged from [facility] in Stable condition.  At the hospital follow up visit please address:  1.  Asthma Any SOB or wheezing? How often is he using his rescue inhaler? Did he finish full course of Prednisone taper?  Chronic venous stasis  Consider compression stockings   2.  Labs / imaging needed at time of follow-up: Pulmonary function testing   3.  Pending labs/ test needing follow-up: None  Follow-up Appointments:     Follow-up Information    Follow up with Dr. Edyth Phillips. Ronnie Phillips . Go on 02/13/2015.   Why:  Appointment at 1:30 pm   Contact information:   [address] Ronnie Phillips [place], [place] [address] [phone]       Consultations:   None  Procedures Performed:  Dg Chest Portable 1 View  01/23/2015   CLINICAL DATA:  Shortness of breath tonight.  EXAM: PORTABLE CHEST - 1 VIEW  COMPARISON:  01/11/2013  FINDINGS: The heart is at the upper limits of normal in size. Pulmonary vasculature is normal. Calcified granuloma in the right upper lung zone, unchanged. Bronchial thickening appears chronic and unchanged. No confluent airspace disease. No large pleural effusion or pneumothorax.  IMPRESSION: Chronic bronchial thickening, similar to prior exam. No superimposed acute process.   Electronically Signed   By: Rubye Oaks M.D.   On: 01/23/2015 05:17    Admission HPI: Ronnie Phillips is a 59 yo male with PMH of Asthma who is visiting [place] for a church convention. He reports that he has been compliant with his home controller medications including breo and PRN albuterol but that his hotel at first did not honor his request for no feather pillows and his first night he was exposed to feather pillows which worsened his asthma (he has since had the hotel remove the pillows) as this is a known trigger for him. He also feels that there is something in the "air" of the convention center that is triggering his asthma. He reports progressive SOB and wheezing over the last day that  culminated this morning to the point he felt he was unable to breath. He called EMS who reported minimal breath sounds, he was given duonebs and  of IV solumedrol en route to [facility]. In the ED he was noted to be hypoxic, ABG revealed a partially compensated respiratory acidosis. He was given further nebulizer treatments in the ED and placed on Bipap, IMTS was called by Dr Elesa Massed for admission.  He reports he has had multiple admissions for asthma exacerbations. He has never been intubated.  Hospital Course by problem list: Active Problems:   Asthma exacerbation   Respiratory acidosis   1. Asthma exacerbation Pt has a hx of asthma  and presented with SOB and wheezing. EMS reported minimal breath sounds, he was given duonebs and  of IV solumedrol en route to [facility]. In the ED he was noted to be hypoxic, ABG revealed a partially compensated respiratory acidosis. He was given further nebulizer treatments in the ED and placed on Bipap. Pt admitted for Asthma exacerbation. When our team saw the patient he was feeling better and was deescalated to [place]. He was given IV Mag 2g, Solumedrol IV 80 mg BID, duoneb treatment, and supplemental O2. The following day (01/24/15), patient was feeling well and not complaining of any SOB, cough, or wheezing. Lungs clear to auscultation, no wheezes. When titrated down to RA and ambulated, he maintained an O2 saturation of >92%. Solumedrol switched to oral Prednisone 40 mg QD. Pt sent home on a 12 day course of Prednisone taper. He is to resume Breo after finishing full course of prednisone, meanwhile can continue using duonebs. He is to f/u with Dr. Venetia Phillips on 8/17.    Chronic venous stasis -Ibuprofen prn pain   Discharge Vitals:   BP 145/84 mmHg  Pulse 92  Temp(Src) 97.8 F (36.6 C) (Oral)  Resp 18  Ht  (1.854 m)  Wt 250 lb (113.4 kg)  BMI 32.99 kg/m2  SpO2 96%  Discharge Labs:  Results for orders placed or performed during the hospital encounter of 01/23/15 (from the past 24 hour(s))  Basic metabolic panel     Status: Abnormal   Collection Time: 01/24/15  5:13 AM  Result Value Ref Range   Sodium 140 135 - 145 mmol/L   Potassium 4.7 3.5 - 5.1 mmol/L   Chloride 101 101 - 111 mmol/L   CO2 33 (H) 22 - 32 mmol/L   Glucose, Bld 125 (H) 65 - 99 mg/dL   BUN 9 6 - 20 mg/dL   Creatinine, Ser 1.61 0.61 - 1.24 mg/dL   Calcium 9.1 8.9 - 09.6 mg/dL   GFR calc non Af Amer >60 >60 mL/min   GFR calc Af Amer >60 >60 mL/min   Anion gap 6 5 - 15    Signed: John Giovanni, MD 01/24/2015, 2:38 PM    Services Ordered on Discharge: None Equipment Ordered on Discharge: None

## 2015-01-24 NOTE — Progress Notes (Signed)
SATURATION QUALIFICATIONS: (This note is used to comply with regulatory documentation for home oxygen)  Patient Saturations on Room Air at Rest = 92-96%  Patient Saturations on Room Air while Ambulating = 89-92%  Patient Saturations on 2 Liters of oxygen while Ambulating = 94%  Please briefly explain why patient needs home oxygen: Patient does not need home O2 at this time.   Leanna Battles, RN.

## 2015-02-13 IMAGING — CR DG CHEST 2V
2 series · 2 of 2 positions shown · non-contrast
Comparison: None.

CLINICAL DATA: History of asthma worse over past week.  Fall.

CHEST - 2 VIEW

[PA]
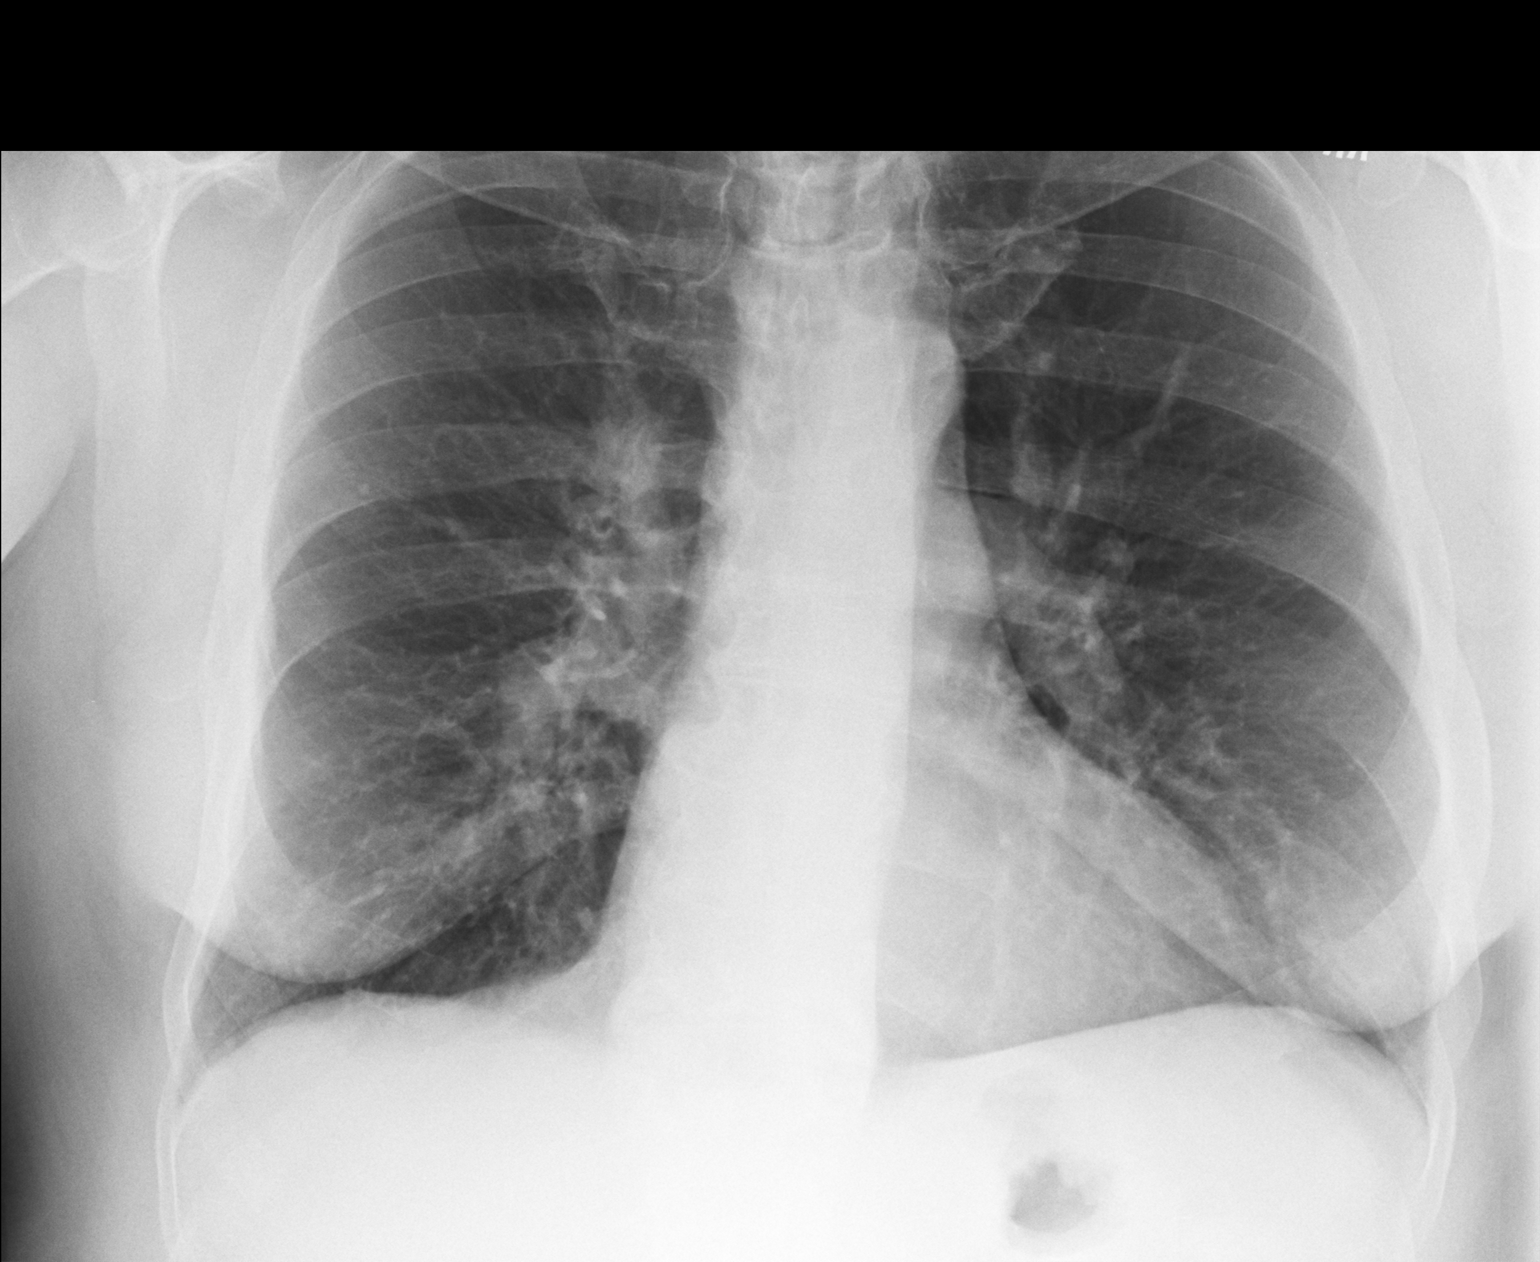

[lateral]
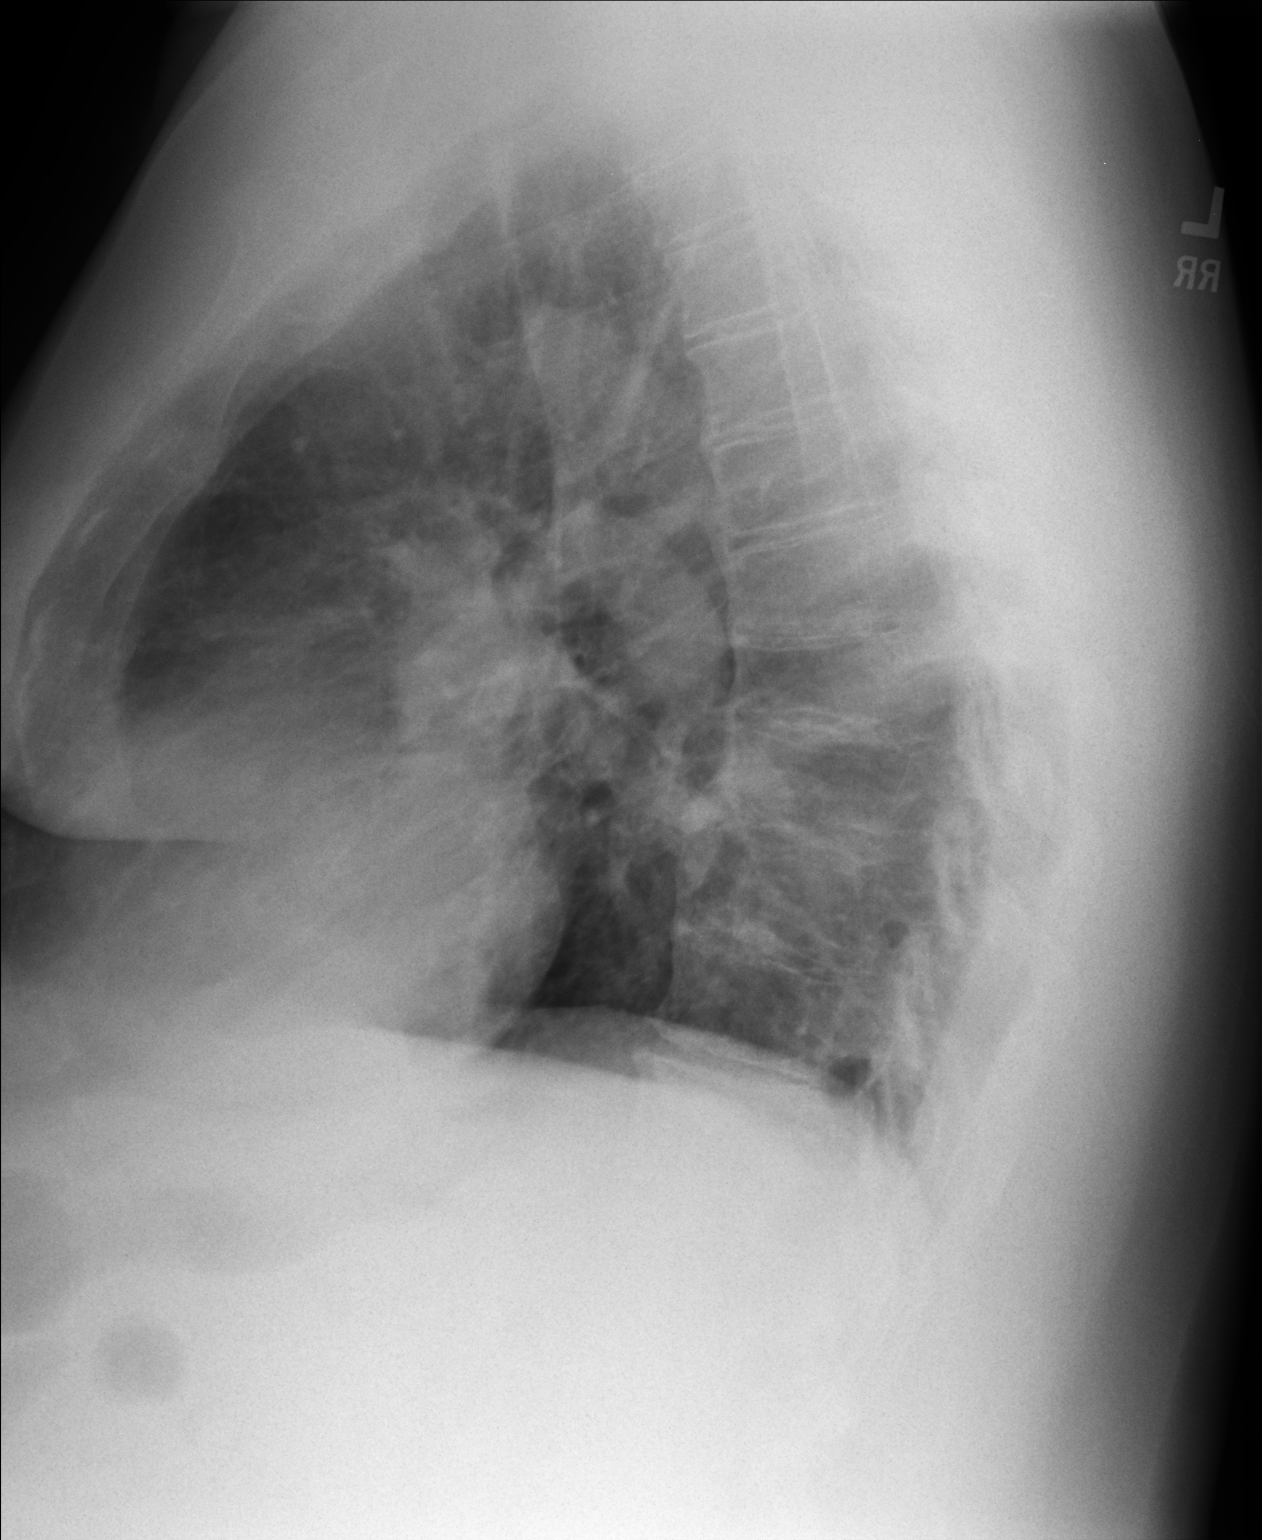

[2 of 2 positions shown; findings below may reference images not displayed]

FINDINGS: Curvature/slight step off at the sternomanubrial junction
on lateral view.  If the patient is tender here, acute injury may
need to be considered.

3 mm nodule peripheral aspect right lung appears dense and may
represent a granuloma.  Stability can be confirmed over time with
next followup examination in 3-6 months.

No infiltrate, congestive heart failure or pneumothorax..

Heart size within normal limits.

Central pulmonary vascular prominence.

Peribronchial thickening may reflect changes of asthma/mild
bronchitis.

Partial fusion thoracic spine.
IMPRESSION: Curvature/slight step off at the sternomanubrial junction on
lateral view.  If the patient is tender here, acute injury may need
to be considered.

3 mm nodule peripheral aspect right lung appears dense and may
represent a granuloma.  Stability can be confirmed over time with
next followup examination in 3-6 months.

Peribronchial thickening may reflect changes of asthma/mild
bronchitis.

Clinically significant discrepancy from primary report, if
provided: As above.  This has been [facility]ie [facility] report utilizing
dashboard call feature..

## 2017-02-24 IMAGING — DX DG CHEST 1V PORT
1 series · 1 of 1 positions shown · non-contrast
Comparison: 01/11/2013

CLINICAL DATA: Shortness of breath tonight.

EXAM:
PORTABLE CHEST - 1 VIEW

[chest ap]
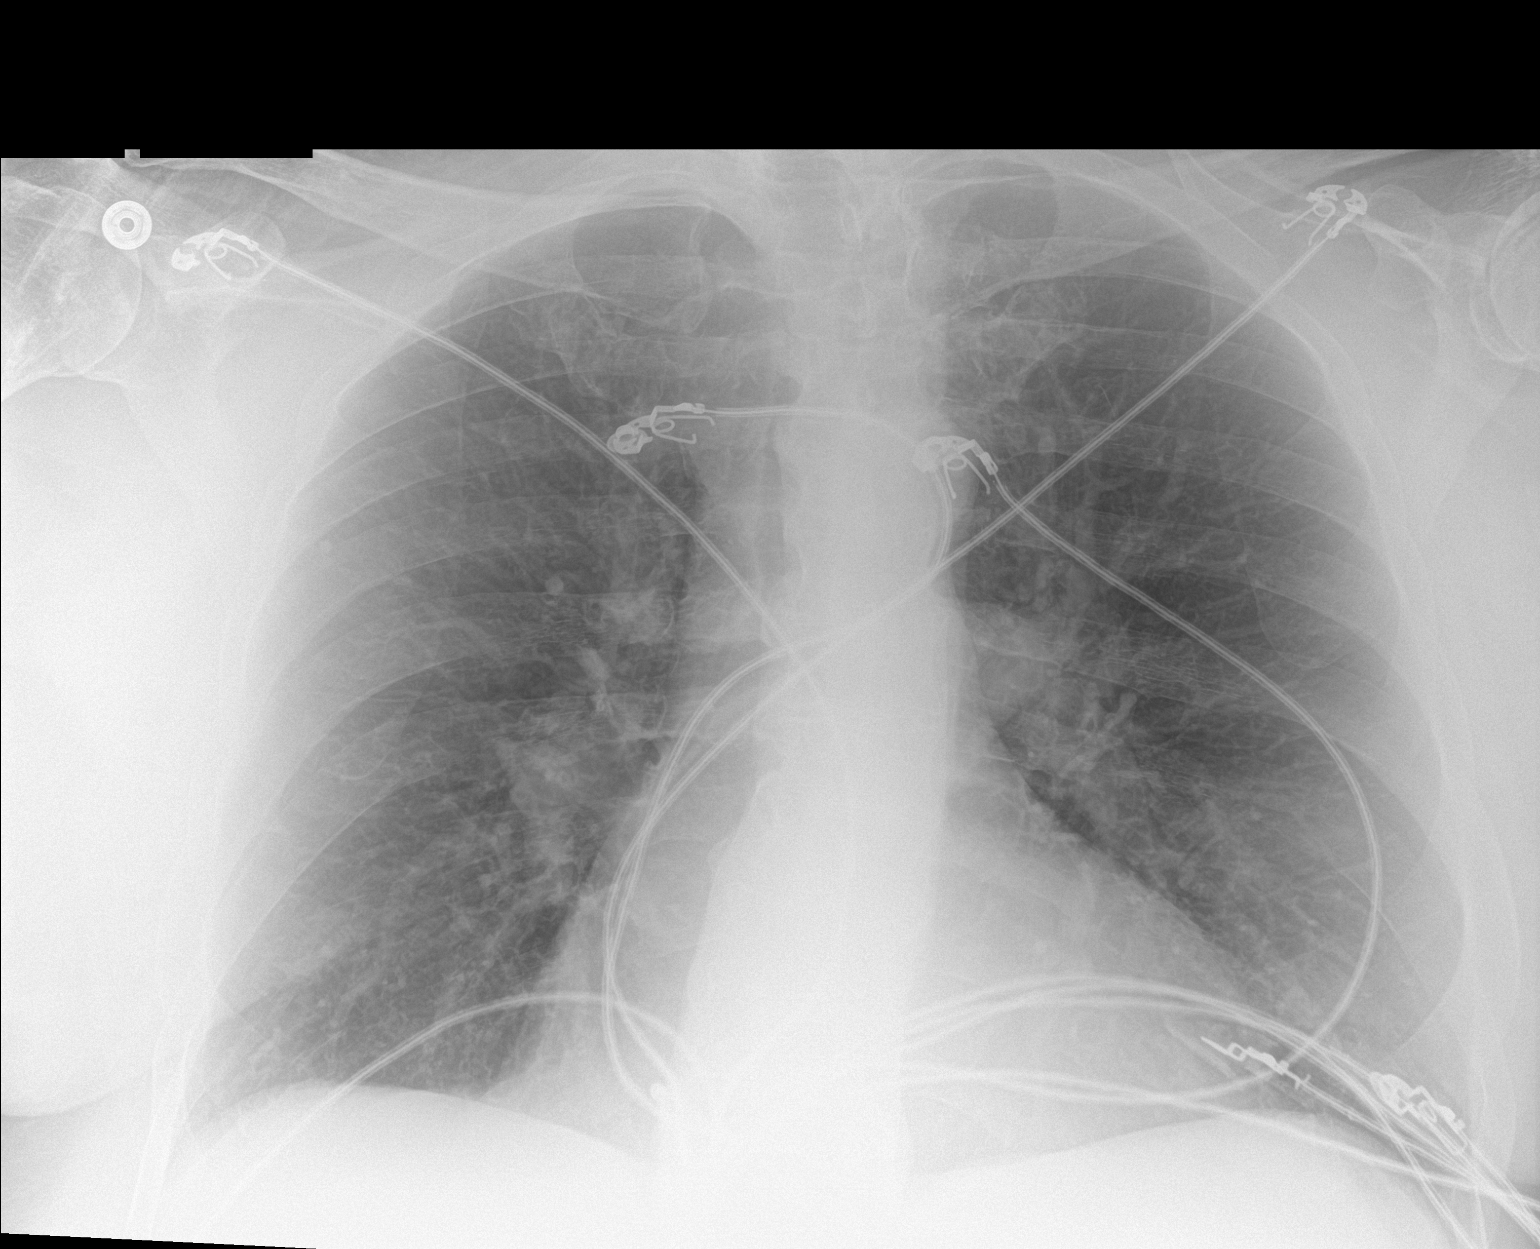

[1 of 1 positions shown; findings below may reference images not displayed]

FINDINGS: The heart is at the upper limits of normal in size. Pulmonary
vasculature is normal. Calcified granuloma in the right upper lung
zone, unchanged. Bronchial thickening appears chronic and unchanged.
No confluent airspace disease. No large pleural effusion or
pneumothorax.
IMPRESSION: Chronic bronchial thickening, similar to prior exam. No superimposed
acute process.
